# Patient Record
Sex: Female | Born: 1985 | Race: White | Hispanic: No | Marital: Married | Smoking: Never smoker
Health system: Southern US, Academic
[De-identification: ages and names within clinical notes are randomized; demographics above are authoritative.]

## PROBLEM LIST (undated history)

## (undated) DIAGNOSIS — N200 Calculus of kidney: Secondary | ICD-10-CM

## (undated) HISTORY — DX: Calculus of kidney: N20.0

## (undated) HISTORY — PX: LASIK: SHX215

## (undated) HISTORY — PX: HX HYSTERECTOMY: SHX81

## (undated) HISTORY — PX: HX OOPHORECTOMY: SHX86

---

## 2008-02-25 ENCOUNTER — Other Ambulatory Visit: Payer: Self-pay

## 2008-03-01 LAB — HISTORICAL CYTOPATHOLOGY-GYN (PAP AND HPV TESTS)

## 2008-04-09 LAB — HISTORICAL SURGICAL PATHOLOGY SPECIMEN

## 2008-05-25 LAB — HISTORICAL SURGICAL PATHOLOGY SPECIMEN

## 2008-09-27 LAB — HISTORICAL CYTOPATHOLOGY-GYN (PAP AND HPV TESTS)

## 2009-06-16 ENCOUNTER — Other Ambulatory Visit: Payer: Self-pay

## 2009-06-20 LAB — HISTORICAL CYTOPATHOLOGY-GYN (PAP AND HPV TESTS)

## 2011-03-26 ENCOUNTER — Ambulatory Visit (HOSPITAL_COMMUNITY): Payer: Self-pay | Admitting: Obstetrics & Gynecology

## 2012-05-01 ENCOUNTER — Other Ambulatory Visit: Payer: Self-pay

## 2012-05-06 LAB — HISTORICAL CYTOPATHOLOGY-GYN (PAP AND HPV TESTS): Final Diagnosis: NEGATIVE

## 2014-09-13 ENCOUNTER — Other Ambulatory Visit: Payer: Self-pay

## 2014-09-14 LAB — HISTORICAL SURGICAL PATHOLOGY SPECIMEN

## 2014-12-10 HISTORY — PX: HX HYSTERECTOMY: SHX81

## 2015-06-27 ENCOUNTER — Other Ambulatory Visit: Payer: Self-pay

## 2015-06-28 LAB — HISTORICAL SURGICAL PATHOLOGY SPECIMEN

## 2018-05-14 ENCOUNTER — Other Ambulatory Visit (HOSPITAL_BASED_OUTPATIENT_CLINIC_OR_DEPARTMENT_OTHER): Payer: Self-pay | Admitting: Obstetrics & Gynecology

## 2018-05-14 ENCOUNTER — Other Ambulatory Visit: Payer: Medicaid Other | Attending: Obstetrics & Gynecology

## 2018-05-14 DIAGNOSIS — N9089 Other specified noninflammatory disorders of vulva and perineum: Secondary | ICD-10-CM | POA: Insufficient documentation

## 2018-05-16 LAB — HISTORICAL SURGICAL PATHOLOGY SPECIMEN

## 2018-06-30 ENCOUNTER — Encounter (INDEPENDENT_AMBULATORY_CARE_PROVIDER_SITE_OTHER): Payer: Self-pay

## 2018-06-30 ENCOUNTER — Ambulatory Visit: Payer: Medicaid Other | Attending: NURSE PRACTITIONER | Admitting: NURSE PRACTITIONER

## 2018-06-30 ENCOUNTER — Ambulatory Visit (INDEPENDENT_AMBULATORY_CARE_PROVIDER_SITE_OTHER): Payer: Medicaid Other | Admitting: NURSE PRACTITIONER

## 2018-06-30 VITALS — BP 110/76 | HR 81 | Temp 98.2°F | Resp 16 | Ht 67.0 in | Wt 181.6 lb

## 2018-06-30 DIAGNOSIS — R3915 Urgency of urination: Secondary | ICD-10-CM

## 2018-06-30 DIAGNOSIS — R300 Dysuria: Secondary | ICD-10-CM

## 2018-06-30 DIAGNOSIS — R8290 Unspecified abnormal findings in urine: Secondary | ICD-10-CM

## 2018-06-30 MED ORDER — SULFAMETHOXAZOLE 800 MG-TRIMETHOPRIM 160 MG TABLET
1.00 | ORAL_TABLET | Freq: Two times a day (BID) | ORAL | 0 refills | Status: AC
Start: 2018-06-30 — End: 2018-07-07

## 2018-06-30 NOTE — Nursing Note (Signed)
06/30/18 1300   Urine test  (Siemens Multistix 10 SG)   Time collected 1314   Color Light   Clarity Cloudy   Glucose Negative   Bilirubin Negative   Ketones Negative   Urine Specific Gravity 1.010   Blood (urine) Small (1+)   pH 5.0   Protein Negative   Urobilinogen 0.2mg /dL (Normal)   Nitrite Negative   Leukocytes (!) 1+   Performed Status Manual   Lot # [id number]   Expiration Date 09/08/18   Initials els

## 2018-06-30 NOTE — Progress Notes (Signed)
[facility]  [address]  [place] [address]    Progress Note    Name: Karen Kennedy MRN:  [id number]   Date: 06/30/2018 Age: 32 y.o.         Reason for Visit: Urinary Tract Infection (started last sunday with left CVA pain, radiated around side into RLQ area, no hematuria, pain is dull now, urgency to urinate, burning with urination)    Nursing Notes:  Nursing Notes:   Starcher, Erica, RT  06/30/18 1315  Signed     07 /22/19 1300   Urine test  (Siemens Multistix 10 SG)   Time collected 1314   Color Light   Clarity Cloudy   Glucose Negative   Bilirubin Negative   Ketones Negative   Urine Specific Gravity 1.010   Blood (urine) Small (1+)   pH 5.0   Protein Negative   Urobilinogen 0.2mg /dL (Normal)   Nitrite Negative   Leukocytes (!) 1+   Performed Status Manual   Lot # [id number]   Expiration Date 09/08/18   Initials els       History of Present Illness  Karen Kennedy is a 32 y.o. female who is being seen today for dysuria, urgency and left CVA pain.  No fever/chills    Past Medical History:   Diagnosis Date   . Kidney stone          Past Surgical History:   Procedure Laterality Date   . HX HYSTERECTOMY     . HX OOPHORECTOMY           Current Outpatient Medications   Medication Sig   . Ibuprofen (MOTRIN) 200 mg Oral Tablet Take 800 mg by mouth Four times a day as needed for Pain   . trimethoprim-sulfamethoxazole (BACTRIM DS) 160-800mg  per tablet Take 1 Tab (160 mg total) by mouth Twice daily for 7 days     Allergies   Allergen Reactions   . Zithromax [Azithromycin] Rash     Family Medical History:     None            Social History     Tobacco Use   . Smoking status: Never Smoker   . Smokeless tobacco: Never Used   Substance Use Topics   . Alcohol use: Never     Frequency: Never   . Drug use: Never       Review of Systems  Review of Systems   Constitutional: Negative for chills and fever.   Gastrointestinal: Negative for abdominal pain, nausea and vomiting.   Genitourinary: Positive for dysuria, flank  pain, frequency and urgency.       Physical Exam:  BP 110/76   Pulse 81   Temp 36.8 C (98.2 F)   Resp 16   Ht 1.702 m (5\' 7" )   Wt 82.4 kg (181 lb 9.6 oz)   SpO2 99%   BMI 28.44 kg/m       Physical Exam   HENT:   Head: Normocephalic.   Cardiovascular: Normal rate.   Pulmonary/Chest: Effort normal.   Abdominal: Soft. She exhibits no distension. There is no tenderness. There is CVA tenderness.   Left CVA tenderness present.   Neurological: She is alert. Gait normal.   Skin: Skin is warm and dry.   Psychiatric: Affect normal.       Assessment:    Dysuria    Abnormal urinalysis    Urgency of urination       Plan:  Urinalysis shows leukocytes,  blood and is cloudy. Will prescribe antibiotic.  Good hydration advised.    Orders Placed This Encounter   . URINE CULTURE   . POCT URINE DIPSTICK   . trimethoprim-sulfamethoxazole (BACTRIM DS) 160-800mg  per tablet        Follow up: Follow up with PCP.  Seek medical attention for new or worsening symptoms.      I saw the patient independently.    Antony HasteCharlotte L Lantz, FNP-BC  06/30/2018, 13:32    I was personally available for consultation during this visit.  I have reviewed the chart and agree with the documentation as recorded by the APP, including the treatment plan and disposition.  Patient encouraged to follow up with PCP; recheck here as needed.    Hosie Spangleobert Kamala Kolton, MD  06/30/2018, 21:00  Electronically signed by Hosie Spangleobert Tinnie Kunin, MD

## 2018-07-02 LAB — URINE CULTURE: URINE CULTURE: >100000

## 2018-10-15 ENCOUNTER — Encounter (HOSPITAL_BASED_OUTPATIENT_CLINIC_OR_DEPARTMENT_OTHER): Payer: Self-pay

## 2018-10-15 ENCOUNTER — Encounter (HOSPITAL_BASED_OUTPATIENT_CLINIC_OR_DEPARTMENT_OTHER): Payer: Self-pay | Admitting: Family

## 2019-05-14 IMAGING — CT CT Abdomen and Pelvis W-Contrast
2 of 3 series · 15 of 46 positions shown, 17 images · IV contrast (Omnipaque)
Comparison: none

CT Abdomen and Pelvis W-Contrast
INDICATION: Abdominal Pain.                                                              
 Pertinent History: Upper abdominal pain, nausea, and diarrhea. Patient reports a history  
 of pancreatitis, states this pain feels the same as previous episodes.Diabetic.           
 Surgical History: : Ventral hernia x 2.   Cholecystectomy.  Colon resection with          
 colostomy and reversal due to diverticulitis                                              
 Cancer: None                                                                              
 GFR (past 30 days): >39 ml/min                                                            
 Metformin: None                                                                           
 Lipase: N/A       Amylase: N/A      WBC: N/A                                              
 Intravenous contrast: 100 mL Omnipaque 350                                                
 Oral contrast: No                                                                         
 Comments: Pregnancy test negative.
TECHNIQUE: Routine with IV contrast. Helical acquisition with sagittal and coronal        
 reformations; post IV contrast images.                                                    
 Utilized dose reduction techniques include: Automated Exposure Control, vendor specific   
 iterative reconstruction technique                                                        
 Comparison is made with previous exam from 12/05/2018.                                    
 The lung bases are clear.                                                                 
 Images of the liver demonstrate diffuse fatty infiltration. The gallbladder is been       
 removed. No gross biliary dilatation. The spleen is normal in size. No imaging evidence   
 of acute pancreatitis. The portal vein, SMA and SMV are patent.                           
 Images the bowel demonstrate no evidence of obstruction. The patient's known left         
 anterior abdominal wall hernia containing the splenic flexure of the colon is             
 redemonstrated. There is no associated bowel obstruction. The focal cystic lesion along   
 the margin of the left lateral abdominal wall hernia with some peripheral calcifications  
 is stable appearing measuring 7.1 x 5.7 cm, probably chronic seroma. Additional           
 multilobulated midline ventral hernia containing fat, grossly stable.                     
 The adrenal glands are unremarkable. Images of the kidneys demonstrate no hydronephrosis  
 or renal mass.                                                                            
 There is no evidence of obstruction. Anastomotic clips are present in the sigmoid colon   
 small apparent blind ending loop with of colon is seen extending off the lateral aspect   
 of the sigmoid, stable appearing.                                                         
 Images of the pelvis demonstrate no free fluid, mass or lymphadenopathy.

[Series 4: ax post · axial · 0.85mm/px · z∈[+602,+992]mm · 12 of 90 slices shown, 14 images]
[im 6/90  soft-tissue]
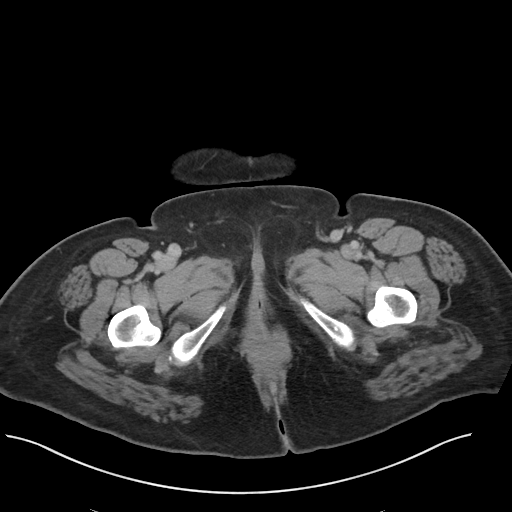
[im 6/90  bone]
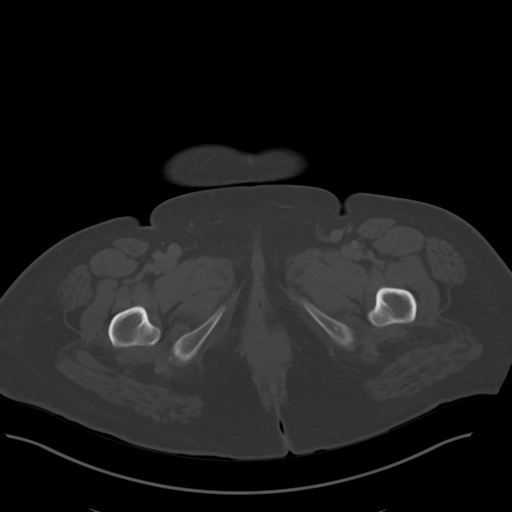
[im 12/90  soft-tissue]
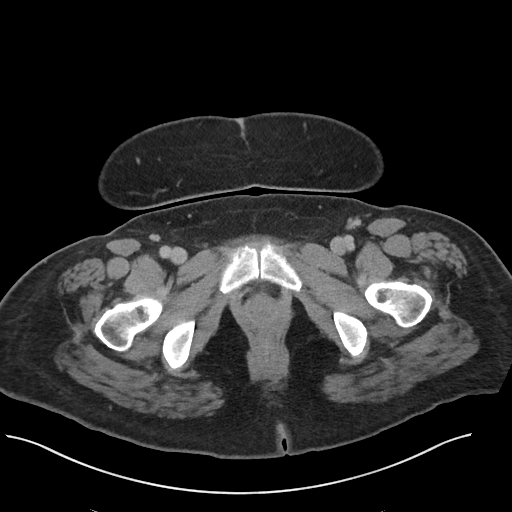
[im 21/90  soft-tissue]
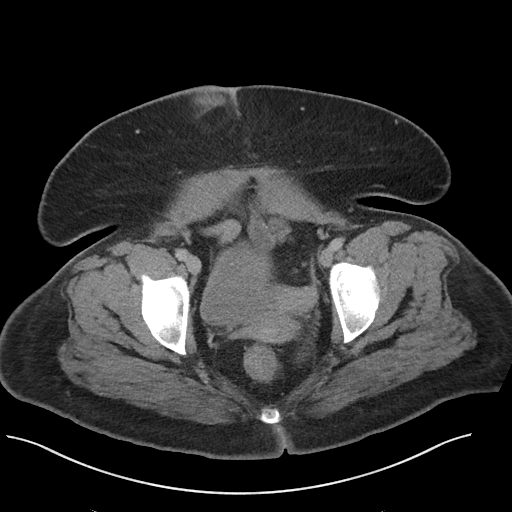
[im 26/90  soft-tissue]
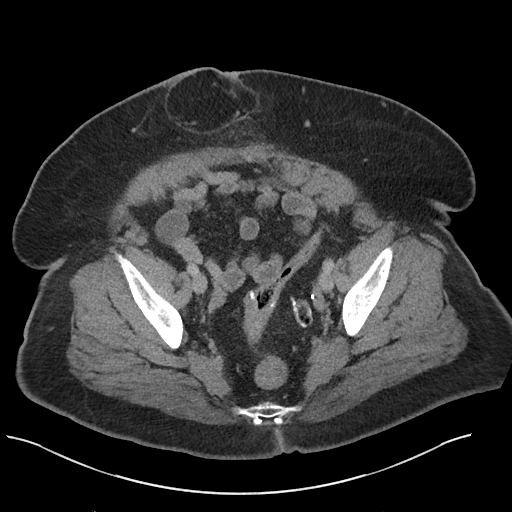
[im 35/90  soft-tissue]
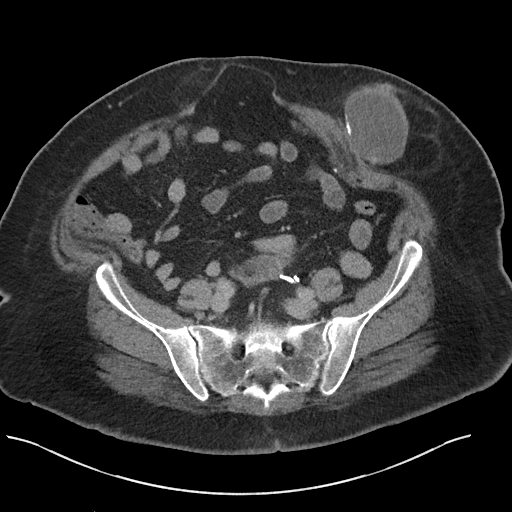
[im 41/90  soft-tissue]
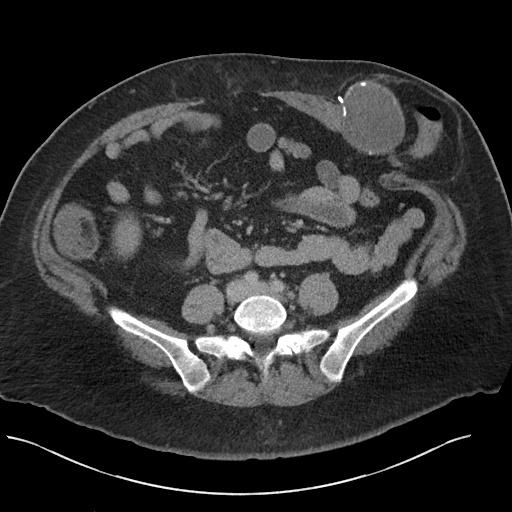
[im 49/90  soft-tissue]
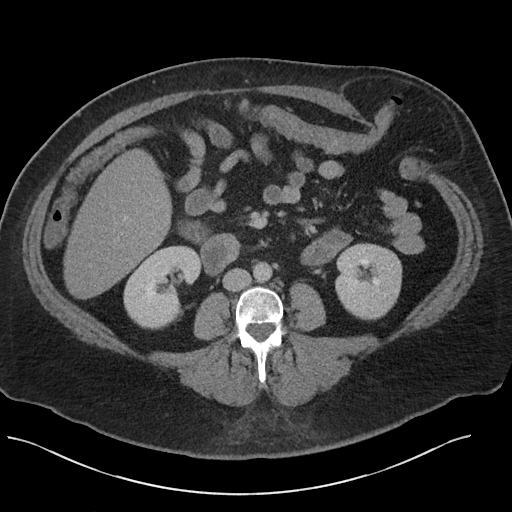
[im 55/90  soft-tissue]
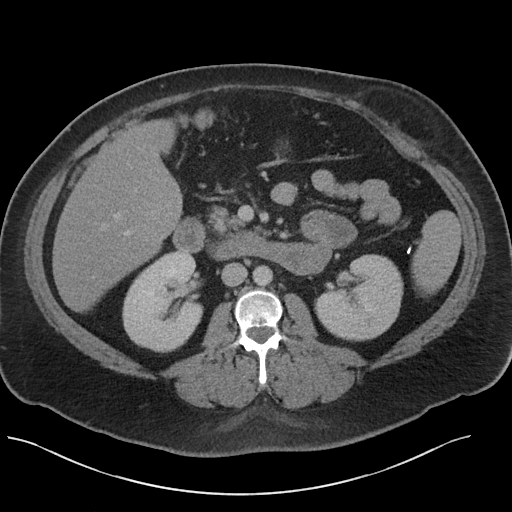
[im 64/90  soft-tissue]
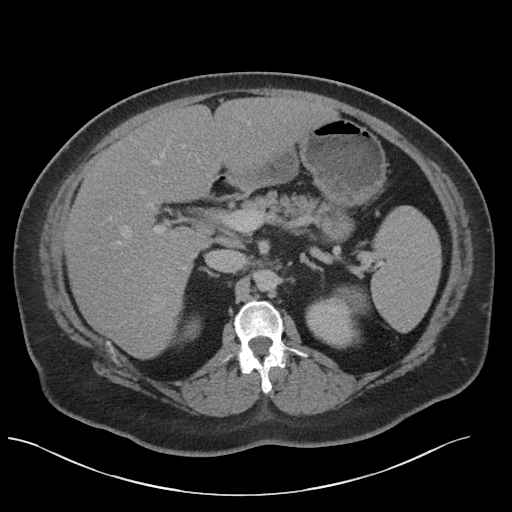
[im 64/90  bone]
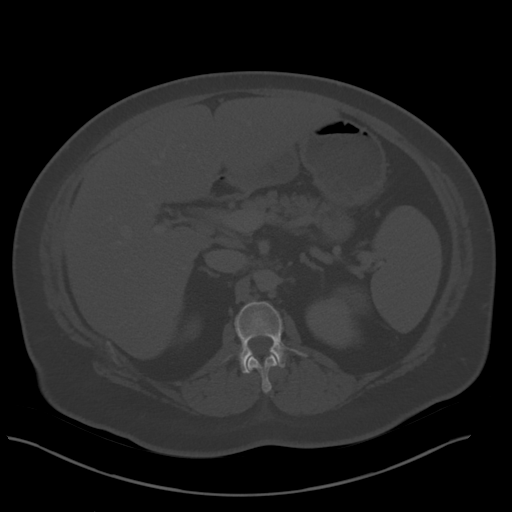
[im 69/90  soft-tissue]
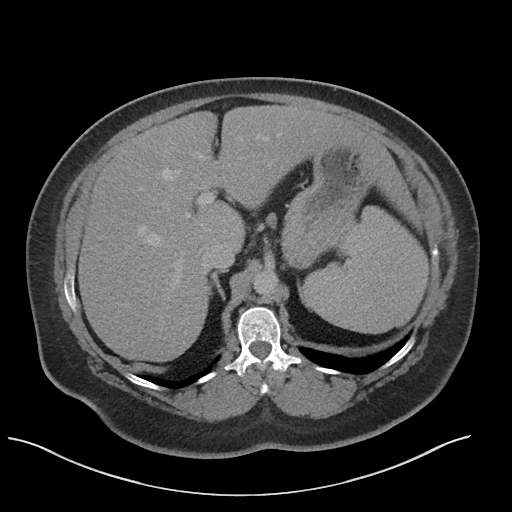
[im 78/90  soft-tissue]
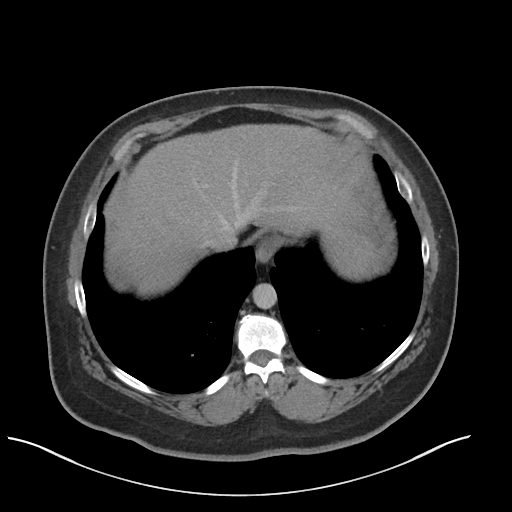
[im 84/90  soft-tissue]
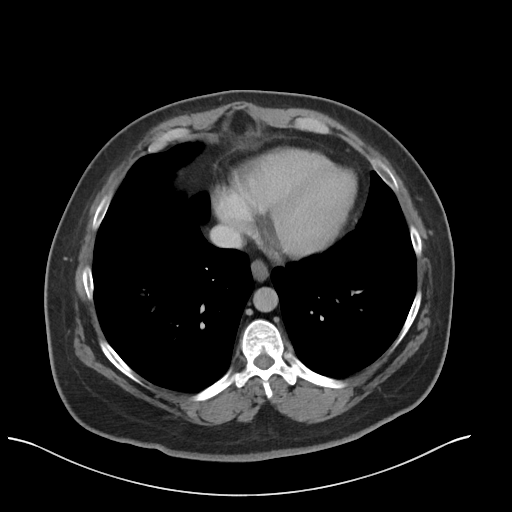

[Series 5: cor post · coronal · 0.82mm/px · 3 of 67 slices shown]
[im 23/67  soft-tissue]
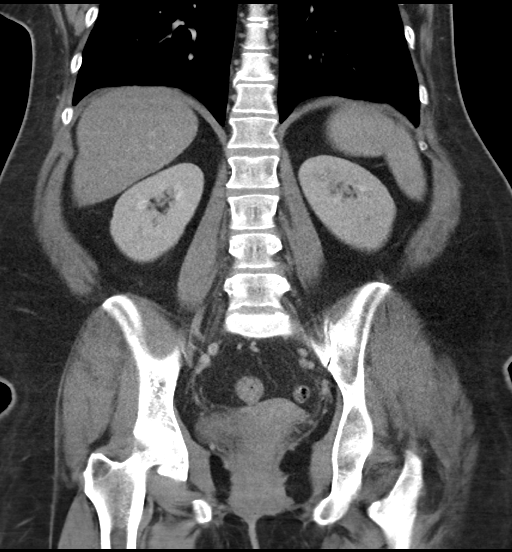
[im 30/67  soft-tissue]
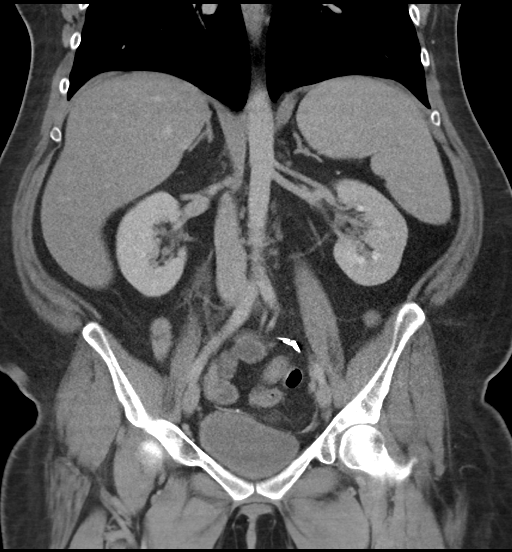
[im 37/67  soft-tissue]
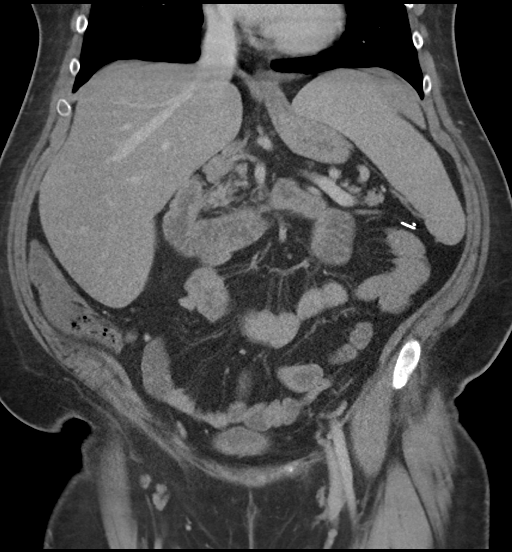

[15 of 46 positions shown; findings below may reference images not displayed]

IMPRESSION: 1. No imaging evidence of acute pancreatitis.                                             
 2. Fatty infiltration liver.                                                              
 3. Stable appearing ventral and left lateral anterior abdominal wall hernias. The large   
 peripherally calcified fluid density collection seen along the mouth of the left lateral  
 abdominal wall hernia is stable, probably chronic seroma.                                 
 4. Stable postsurgical changes sigmoid colon.

## 2021-03-31 ENCOUNTER — Other Ambulatory Visit (HOSPITAL_BASED_OUTPATIENT_CLINIC_OR_DEPARTMENT_OTHER): Payer: Self-pay | Admitting: Obstetrics & Gynecology

## 2021-03-31 DIAGNOSIS — F3289 Other specified depressive episodes: Secondary | ICD-10-CM

## 2021-03-31 MED ORDER — BUPROPION HCL XL 300 MG 24 HR TABLET, EXTENDED RELEASE
300.0000 mg | ORAL_TABLET | Freq: Every day | ORAL | 4 refills | Status: AC
Start: 2021-03-31 — End: 2021-04-30

## 2021-03-31 NOTE — Telephone Encounter (Signed)
Received a refill request for Bupropion 300 MG Tablet by fax. Scheduled AE on 06-16-21 with [facility]. Had last AE 05-20-20.    [facility]

## 2021-06-16 ENCOUNTER — Ambulatory Visit: Payer: Medicaid Other | Attending: Obstetrics & Gynecology

## 2021-06-16 ENCOUNTER — Ambulatory Visit (INDEPENDENT_AMBULATORY_CARE_PROVIDER_SITE_OTHER): Payer: Medicaid Other | Admitting: Obstetrics & Gynecology

## 2021-06-16 ENCOUNTER — Encounter (HOSPITAL_BASED_OUTPATIENT_CLINIC_OR_DEPARTMENT_OTHER): Payer: Self-pay | Admitting: Obstetrics & Gynecology

## 2021-06-16 ENCOUNTER — Other Ambulatory Visit: Payer: Self-pay

## 2021-06-16 VITALS — BP 122/74 | Wt 187.0 lb

## 2021-06-16 DIAGNOSIS — R5383 Other fatigue: Secondary | ICD-10-CM

## 2021-06-16 DIAGNOSIS — F3289 Other specified depressive episodes: Secondary | ICD-10-CM | POA: Insufficient documentation

## 2021-06-16 DIAGNOSIS — Z79899 Other long term (current) drug therapy: Secondary | ICD-10-CM | POA: Insufficient documentation

## 2021-06-16 DIAGNOSIS — Z01419 Encounter for gynecological examination (general) (routine) without abnormal findings: Secondary | ICD-10-CM

## 2021-06-16 LAB — CBC WITH DIFF
BASOPHIL #: <0.1 10*3/uL (ref <=0.20)
BASOPHIL %: 1 %
EOSINOPHIL #: <0.1 10*3/uL (ref <=0.50)
EOSINOPHIL %: 1 %
HCT: 42.1 % (ref 34.8–46.0)
HGB: 13.9 g/dL (ref 11.5–16.0)
IMMATURE GRANULOCYTE #: <0.1 10*3/uL (ref <0.10)
IMMATURE GRANULOCYTE %: 0 % (ref 0–1)
LYMPHOCYTE #: 1.83 10*3/uL (ref 1.00–4.80)
LYMPHOCYTE %: 25 %
MCH: 30.3 pg (ref 26.0–32.0)
MCHC: 33 g/dL (ref 31.0–35.5)
MCV: 91.9 fL (ref 78.0–100.0)
MONOCYTE #: 0.33 10*3/uL (ref 0.20–1.10)
MONOCYTE %: 5 %
MPV: 9.5 fL (ref 8.7–12.5)
NEUTROPHIL #: 4.97 10*3/uL (ref 1.50–7.70)
NEUTROPHIL %: 68 %
PLATELETS: 193 10*3/uL (ref 150–400)
RBC: 4.58 10*6/uL (ref 3.85–5.22)
RDW-CV: 12.3 % (ref 11.5–15.5)
WBC: 7.3 10*3/uL (ref 3.7–11.0)

## 2021-06-16 LAB — LIPID PANEL
CHOL/HDL RATIO: 5
CHOLESTEROL: 229 mg/dL — ABNORMAL HIGH (ref 100–200)
HDL CHOL: 46 mg/dL — ABNORMAL LOW (ref >=50)
LDL CALC: 149 mg/dL — ABNORMAL HIGH (ref <100)
NON-HDL: 183 mg/dL (ref <=190)
TRIGLYCERIDES: 168 mg/dL — ABNORMAL HIGH (ref <150)
VLDL CALC: 34 mg/dL — ABNORMAL HIGH (ref <30)

## 2021-06-16 LAB — COMPREHENSIVE METABOLIC PNL, FASTING
ALBUMIN: 4 g/dL (ref 3.5–5.0)
ALKALINE PHOSPHATASE: 71 U/L (ref 40–110)
ALT (SGPT): 17 U/L (ref 8–22)
ANION GAP: 6 mmol/L (ref 4–13)
AST (SGOT): 14 U/L (ref 8–45)
BILIRUBIN TOTAL: 0.6 mg/dL (ref 0.3–1.3)
BUN/CREA RATIO: 12 (ref 6–22)
BUN: 10 mg/dL (ref 8–25)
CALCIUM: 9.5 mg/dL (ref 8.5–10.0)
CHLORIDE: 103 mmol/L (ref 96–111)
CO2 TOTAL: 31 mmol/L — ABNORMAL HIGH (ref 22–30)
CREATININE: 0.86 mg/dL (ref 0.60–1.05)
ESTIMATED GFR: >90 mL/min/BSA (ref >=60)
GLUCOSE: 97 mg/dL (ref 70–99)
POTASSIUM: 3.8 mmol/L (ref 3.5–5.1)
PROTEIN TOTAL: 6.9 g/dL (ref 6.4–8.3)
SODIUM: 140 mmol/L (ref 136–145)

## 2021-06-16 LAB — THYROID STIMULATING HORMONE WITH FREE T4 REFLEX: TSH: 1.508 u[IU]/mL (ref 0.430–3.550)

## 2021-06-16 LAB — ESTRADIOL: ESTRADIOL: 68 pg/mL

## 2021-06-16 LAB — FSH: FSH: 4.4 m[IU]/mL

## 2021-06-16 MED ORDER — BUPROPION HCL XL 150 MG 24 HR TABLET, EXTENDED RELEASE
150.0000 mg | ORAL_TABLET | Freq: Every day | ORAL | 11 refills | Status: DC
Start: 2021-06-16 — End: 2021-07-10

## 2021-06-16 MED ORDER — PHENTERMINE 37.5 MG TABLET
ORAL_TABLET | ORAL | 2 refills | Status: DC
Start: 2021-06-16 — End: 2022-10-08

## 2021-06-16 NOTE — Progress Notes (Signed)
[facility]  Gynecology Consult    Karen Kennedy, Karen Kennedy, 35 y.o. female  Date of Admission:  (Not on file)  Date of service: 06/16/2021  Date of Birth:  [birth year]    PCP: No Pcp     Nursing Notes:   BoardRolly Salter, LPN  56/81/27 5170  Signed  BCM: TVH   Bone Density: NONE  Colonoscopy: NONE  Last Mammo: NONE  Last Pap:05/13/2017    HEMORRHOID THAT WONT GO AWAY   WANTS TO GO BACK TO THE WELLBUTRIN XL 150MG  STILL NO ENERGY        Information Obtained from: patient  Chief Complaint   Patient presents with   . Annual Exam        HPI/subjective     Karen Kennedy is a 35 y.o.No obstetric history on file., White female who presents for an annual well woman exam. Pt verbalizes that she wants to decrease her Wellbutrin to 150 mg due to increased fatigue. Discussed with the pt the need to exercise and began B12. Additionally, she complains of a persistent non bleeding/non painful hemorrhoid.  Discussed increasing physical activity.  She states that she sleeps well at night.  Will check laboratory studies on her.  Will try a injection of vitamin B12.  Will try on phentermine Number 30 refill x2.  Will see her back in November for re-evaluation.  Will also check her normal level as she still has 1 ovary    Objective    ROS:   Constitutional: + Fatigue. No fever, chills.  Cardiovascular: No chest pain, palpitations  Respiratory: No cough, shortness of breath  Abdomen: No abdominal pain, nausea, vomiting  Genitourinary: No vaginal discharge, vaginal bleeding  Rectal: + Non bleeding/non painful hemorrhoid.  Musculoskeletal: No joint pain or swelling  Neurologic: No headaches, dizziness, or paresthesias    PHYSICAL EXAMINATION:      Weight: 84.8 kg (187 lb)     BP (Non-Invasive): 122/74  No LMP recorded. Patient has had a hysterectomy.     Constitutional: No acute distress, well-developed, well-nourished  Head: Atraumatic, normocephalic  Cardiovascular: Heart with regular rate and rhythm, no murmurs, rubs, or gallops, normal  pulses  Respiratory: Breath sounds are clear, no wheezes, rales, or rhonchi  Abdomen: Abdomen is soft, non-tender without masses, no rebound or guarding  Breast: Symmetrical breast, no masses palpitated.  Genitourinary: Pap obtained. No adnexal masses palpitated.   Rectal: No external hemorrhoid. No masses palpitated.   Extremities: No cyanosis or edema  Neurologic: Grossly intact, alert and oriented x3, normal gait  Psychiatric: Normal affect and behavior    Family History:      OB History   Gravida Para Term Preterm AB Living   2 2 2          SAB IAB Ectopic Multiple Live Births                  # Outcome Date GA Lbr Len/2nd Weight Sex Delivery Anes PTL Lv   2 Term            1 Term              Past Medical History:   Diagnosis Date   . Kidney stone          Past Surgical History:   Procedure Laterality Date   . HX HYSTERECTOMY     . HX OOPHORECTOMY           Allergies   Allergen Reactions   .  Zithromax [Azithromycin] Rash     Dating Summary     No pregnancy episode available         Current Outpatient Medications   Medication Sig   . acyclovir (ZOVIRAX) 800 mg Oral Tablet Take 800 mg by mouth Once a day   . buPROPion (WELLBUTRIN XL) 300 mg extended release 24 hr tablet Take 1 Tablet (300 mg total) by mouth Once a day for 30 days   . Ibuprofen (MOTRIN) 200 mg Oral Tablet Take 800 mg by mouth Four times a day as needed for Pain       Impression/Recommendations:  1. Encounter for well woman exam with routine gynecological exam    2. Other depression        I am scribing for, and in the presence of, Dr. Valla Leaver for services provided on 06/16/2021.  Damaris Hippo, SCRIBE     I personally performed the services described in this documentation, as scribed  in my presence, and it is both accurate  and complete.    Radene Ou, MD

## 2021-06-16 NOTE — Nursing Note (Signed)
06/16/21 1300   Medication Administration   Initials HB   Witness Initials KAF   Medication  B 12   Medication Concentration 1000MCG/ML   Medication Dose Given   Route of Administration IM   Site Right Deltoid   NDC # A9130358   LOT # [id number]   Expiration date 11/08/22   Manufacturer [employer] K[employer]   Clinic Supplied Yes   Patient Supplied No   Comments: PATIENT TOLERATED WELL. CONSENT SIGNED BY ALL PARTIES.

## 2021-06-16 NOTE — Nursing Note (Signed)
BCM: TVH   Bone Density: NONE  Colonoscopy: NONE  Last Mammo: NONE  Last Pap:05/13/2017    HEMORRHOID THAT WONT GO AWAY   WANTS TO GO BACK TO THE WELLBUTRIN XL 150MG  STILL NO ENERGY

## 2021-06-18 LAB — HGA1C (HEMOGLOBIN A1C WITH EST AVG GLUCOSE): HEMOGLOBIN A1C: 5 % (ref <5.7)

## 2021-07-10 ENCOUNTER — Other Ambulatory Visit (HOSPITAL_BASED_OUTPATIENT_CLINIC_OR_DEPARTMENT_OTHER): Payer: Self-pay | Admitting: Obstetrics & Gynecology

## 2021-07-10 DIAGNOSIS — F3289 Other specified depressive episodes: Secondary | ICD-10-CM

## 2021-07-10 MED ORDER — BUPROPION HCL XL 150 MG 24 HR TABLET, EXTENDED RELEASE
150.0000 mg | ORAL_TABLET | Freq: Every day | ORAL | 11 refills | Status: DC
Start: 2021-07-10 — End: 2022-08-06

## 2021-07-10 NOTE — Telephone Encounter (Signed)
[facility]'d [facility] [address] for [facility] request for Bupropion hcl XL 150 mg tablets    Patients last annual was 05/20/20    Thank you, les

## 2021-07-12 ENCOUNTER — Telehealth (HOSPITAL_BASED_OUTPATIENT_CLINIC_OR_DEPARTMENT_OTHER): Payer: Self-pay | Admitting: Obstetrics & Gynecology

## 2021-07-12 DIAGNOSIS — E782 Mixed hyperlipidemia: Secondary | ICD-10-CM

## 2021-07-12 NOTE — Nursing Note (Signed)
Patient called and we reviewed the lab work with her and she will try diet and exercise for now instead of starting something for elevated cholesterol levels. And she will repeat those labs in about 6-8 weeks and see what they are then. Order was placed in the computer and patient verbalized understanding

## 2021-07-12 NOTE — Telephone Encounter (Signed)
Karen Kennedy was here in early July and had her pap and labs done. She doesn't have [place] my chart and would like a nurse to call her with the results. Thanks!

## 2021-07-12 NOTE — Telephone Encounter (Signed)
Left a message for the patient advising her that we did not complete a pap on her since she had a hysterectomy

## 2021-07-28 ENCOUNTER — Telehealth (HOSPITAL_BASED_OUTPATIENT_CLINIC_OR_DEPARTMENT_OTHER): Payer: Self-pay | Admitting: Obstetrics & Gynecology

## 2021-07-28 NOTE — Telephone Encounter (Signed)
The phone is not able to call out long distance at this time and has been down since 8 am this morning

## 2021-07-28 NOTE — Telephone Encounter (Signed)
Patient called. Stated she had a B12 shot about a month ago and wants to know when she can have another one.    [phone]  She is at work. If no answer, leave voice mail    Thank you, [place], Office Staff 07/28/21 09:23

## 2021-07-28 NOTE — Telephone Encounter (Signed)
No MyChart is set up for this patient to send her a message

## 2021-08-03 ENCOUNTER — Other Ambulatory Visit: Payer: Self-pay

## 2021-08-03 ENCOUNTER — Ambulatory Visit (INDEPENDENT_AMBULATORY_CARE_PROVIDER_SITE_OTHER): Payer: Medicaid Other

## 2021-08-03 DIAGNOSIS — R5383 Other fatigue: Secondary | ICD-10-CM

## 2021-08-03 NOTE — Nursing Note (Signed)
08/03/21 1000   B12 Injection flowsheet   Medication Vitamin B12   Dose (mcg) 1,000 mcg   Site Right Deltoid   Route IM   Lot # [id number]   Expiration date 11/08/22   Manufacturer [employer] kabi   NDC # 95638-756-43   Initials HB   Who supplied B12 Clinic supplied   Comment PER V.O. DR. Valla Leaver PATIENT WAS GIVEN A VITAMIN B12 INJECTION IM IN THE RIGHT DELTOID AND TOLERATED IT WELL. CONSENT FORM WAS SIGNED BY ALL PARTIES.

## 2021-08-10 ENCOUNTER — Other Ambulatory Visit (HOSPITAL_BASED_OUTPATIENT_CLINIC_OR_DEPARTMENT_OTHER): Payer: Self-pay | Admitting: Obstetrics & Gynecology

## 2021-08-10 MED ORDER — ACYCLOVIR 800 MG TABLET
800.0000 mg | ORAL_TABLET | Freq: Every day | ORAL | 4 refills | Status: AC
Start: 2021-08-10 — End: 2021-09-09

## 2021-08-10 NOTE — Telephone Encounter (Signed)
[facility] for a refill request on Acyclovir 800 mg tabs.     Last annual visit: 06/16/21  Appt for 10/16/21 for gyn visit.    Thank you, [place], Office Staff 08/10/21 08:39

## 2021-09-07 ENCOUNTER — Other Ambulatory Visit: Payer: Self-pay

## 2021-09-07 ENCOUNTER — Ambulatory Visit (INDEPENDENT_AMBULATORY_CARE_PROVIDER_SITE_OTHER): Payer: Medicaid Other

## 2021-09-07 DIAGNOSIS — R5383 Other fatigue: Secondary | ICD-10-CM

## 2021-09-07 MED ORDER — CYANOCOBALAMIN (VIT B-12) 1,000 MCG/ML INJECTION SOLUTION
1000.0000 ug | INTRAMUSCULAR | Status: AC
Start: 2021-09-07 — End: 2021-09-07
  Administered 2021-09-07: 1000 ug via INTRAMUSCULAR

## 2021-10-09 ENCOUNTER — Ambulatory Visit (INDEPENDENT_AMBULATORY_CARE_PROVIDER_SITE_OTHER): Payer: Medicaid Other

## 2021-10-09 ENCOUNTER — Other Ambulatory Visit: Payer: Self-pay

## 2021-10-09 VITALS — Ht 67.0 in | Wt 183.0 lb

## 2021-10-09 DIAGNOSIS — R5383 Other fatigue: Secondary | ICD-10-CM

## 2021-10-09 NOTE — Nursing Note (Signed)
10/09/21 1100   Medication Administration   Initials LR   Witness Initials CKH   Medication  B 12   Medication Dose Given 1   Medication Volume Given (mL) ml   Route of Administration IM   Site Left Deltoid   NDC # [id number]   LOT # [id number]   Expiration date 11/08/22   Manufacturer [employer]   Clinic Supplied Yes   Patient Supplied No

## 2021-10-16 ENCOUNTER — Ambulatory Visit (HOSPITAL_BASED_OUTPATIENT_CLINIC_OR_DEPARTMENT_OTHER): Payer: Medicaid Other | Admitting: Obstetrics & Gynecology

## 2021-10-20 ENCOUNTER — Ambulatory Visit (HOSPITAL_BASED_OUTPATIENT_CLINIC_OR_DEPARTMENT_OTHER): Payer: Self-pay | Admitting: Obstetrics & Gynecology

## 2021-11-23 ENCOUNTER — Encounter (HOSPITAL_BASED_OUTPATIENT_CLINIC_OR_DEPARTMENT_OTHER): Payer: Self-pay

## 2021-11-23 ENCOUNTER — Other Ambulatory Visit: Payer: Self-pay

## 2021-11-23 ENCOUNTER — Ambulatory Visit (INDEPENDENT_AMBULATORY_CARE_PROVIDER_SITE_OTHER): Payer: Medicaid Other

## 2021-11-23 DIAGNOSIS — R5383 Other fatigue: Secondary | ICD-10-CM

## 2021-11-23 NOTE — Nursing Note (Signed)
11/23/21 0800   B12 Injection flowsheet   Medication Vitamin B12   Dose (mcg) 1,000 mcg   Site Right Deltoid   Route IM   Lot # [id number]   Expiration date 03/11/23   Manufacturer [employer]   NDC # 9811-9147-82   Initials CKH   Who supplied B12 Clinic supplied   Comment patient tolerated well

## 2021-12-10 ENCOUNTER — Encounter (INDEPENDENT_AMBULATORY_CARE_PROVIDER_SITE_OTHER): Payer: Medicaid Other | Admitting: Radiology

## 2021-12-10 ENCOUNTER — Other Ambulatory Visit: Payer: Self-pay

## 2021-12-10 ENCOUNTER — Ambulatory Visit (INDEPENDENT_AMBULATORY_CARE_PROVIDER_SITE_OTHER): Payer: Medicaid Other | Admitting: NURSE PRACTITIONER

## 2021-12-10 ENCOUNTER — Encounter (INDEPENDENT_AMBULATORY_CARE_PROVIDER_SITE_OTHER): Payer: Self-pay

## 2021-12-10 VITALS — BP 98/72 | HR 79 | Temp 97.2°F | Resp 16 | Ht 67.0 in | Wt 188.3 lb

## 2021-12-10 DIAGNOSIS — S6991XA Unspecified injury of right wrist, hand and finger(s), initial encounter: Secondary | ICD-10-CM

## 2021-12-10 DIAGNOSIS — W19XXXA Unspecified fall, initial encounter: Secondary | ICD-10-CM

## 2021-12-10 DIAGNOSIS — S4992XA Unspecified injury of left shoulder and upper arm, initial encounter: Secondary | ICD-10-CM

## 2021-12-10 NOTE — Progress Notes (Signed)
URGENT CARE, [facility]  [address]  [place] [address]    Progress Note    Name: Karen Kennedy MRN:  [id number]   Date: 12/10/2021 Age: 36 y.o.         Reason for Visit: Injury [place] on ice 12-03-21, injured rt wrist, left shoulder.) and  Pain (Left rib, since Oct after coughing)    Nursing Notes:  There are no exam notes on file for this visit.    History of Present Illness  Karen Kennedy is a 36 y.o. female who is being seen today for above symptoms which began about one week ago due to fall on outstretched right hand and pain has continued. Patient is also having some left posterior lower ribcage pain which began during respiratory infection causing cough and has persisted. Patient denies any other rib injury.  Patient has seen chiropractor this pain.  Patient has taken no medications for the pain/symptoms.     Past Medical History:   Diagnosis Date   . Kidney stone          Past Surgical History:   Procedure Laterality Date   . HX HYSTERECTOMY     . HX OOPHORECTOMY     . LASIK           Current Outpatient Medications   Medication Sig   . buPROPion (WELLBUTRIN XL) 150 mg extended release 24 hr tablet Take 1 Tablet (150 mg total) by mouth Once a day for 30 days   . buPROPion (WELLBUTRIN XL) 300 mg extended release 24 hr tablet Take 1 Tablet (300 mg total) by mouth Once a day for 30 days   . cyanocobalamin (VITAMIN B12) 1,000 mcg/mL Injection Solution 1 mL (1,000 mcg total) Every 30 days   . Ibuprofen (MOTRIN) 200 mg Oral Tablet Take 4 Tablets (800 mg total) by mouth Four times a day as needed for Pain   . phentermine (ADIPEX-P) 37.5 mg Oral Tablet One p.o. q.a.m.     Allergies   Allergen Reactions   . Zithromax [Azithromycin] Rash     Family Medical History:    None         Social History     Tobacco Use   . Smoking status: Never   . Smokeless tobacco: Never   Substance Use Topics   . Alcohol use: Never   . Drug use: Never       Review of Systems  Review of Systems   Constitutional:  Negative for chills and fever.   Respiratory: Negative for sputum production and shortness of breath.    Cardiovascular: Negative for chest pain and palpitations.   Musculoskeletal: Positive for joint pain.        See HPI   Neurological: Negative for tingling and sensory change.       Physical Exam:  BP 98/72   Pulse 79   Temp 36.2 C (97.2 F)   Resp 16   Ht 1.702 m (5\' 7" )   Wt 85.4 kg (188 lb 4.4 oz)   SpO2 99%   BMI 29.49 kg/m       Physical Exam  Pulmonary:      Effort: Pulmonary effort is normal.   Chest:      Comments: Posterior left lower ribcage tender to palpation  Musculoskeletal:      Left shoulder: No swelling or deformity.      Right elbow: Normal.      Right wrist: Bony tenderness present. No  deformity or snuff box tenderness. Normal pulse.      Comments: Lateral aspect of anterior shoulder is tender to palpation.  Right wrist, distal radial side of wrist tender to palpation  Distal sensation intact, move fingers without difficulty,  No deformity.    Neurological:      Mental Status: She is alert.         Assessment:    Fall    Injury of right wrist, initial encounter    Injury of left shoulder, initial encounter    Injury of left acromioclavicular joint, initial encounter         Plan:  Rest  Apply ice 20 minutes several times daily.  Xray results explained to patient.  I advised to avoid lifting with left shoulder, leaning on left elbow and other activities that might aggravated the problem.  OTC Ibuprofen 200mg  po tid next one week.  Notify clinic if symptoms not improving next one week.  Discussed possible PT, patient declined at current time.  Written patient education information provided.   XR SHOULDER LEFT    Result Date: 12/10/2021  EXAMINATION: Left shoulder HISTORY: [place] one week earlier with left shoulder injury. Persistent pain. 3 views of left shoulder were obtained. Head is normally located. Bone mineralization is normal. No acute fracture is identified. There is borderline  widening of the left AC joint measuring 8 mm which could indicate grade 1 AC joint injury. Please correlate with any point tenderness in this region. Dedicated films of the AC joints could be obtained if clinically appropriate.     1. Possible grade 1 left AC joint injury. 2. No acute fracture. Radiologist location ID: [id number]     XR WRIST RIGHT    Result Date: 12/10/2021  EXAMINATION: Right wrist HISTORY: [place] one week earlier with right wrist injury. Four views of the right wrist were obtained, including a scaphoid view. Bone mineralization is normal. No acute fracture is identified. No destructive bony lesions are seen. There is no significant arthritic process. The scaphoid is intact. Small accessory ossicle seen adjacent to the tip of the ulnar styloid process.     1. No acute bony injury about the right wrist. Radiologist location ID: [id number]     Orders Placed This Encounter   . XR WRIST RIGHT   . XR SHOULDER LEFT        MDM:       During the patient's visit at urgent care patient's chart was reviewed; HPI, ROS and physical exam were completed to assist in medical decision making.     Medications were reviewed and reconciled. Medication instructions and side effects were discussed.   Advised to seek immediate medical care with any new, worsening, or concerning symptoms.   Opportunity to ask questions was provided and all questions were answered.   Discussed diagnosis and management including indications for return, importance of close follow up and supportive care measures prior to patient discharge.       Follow up: Follow up with PCP.          Thomasenia Bottoms, FNP-BC  12/10/2021, 16:22

## 2021-12-25 ENCOUNTER — Encounter (HOSPITAL_BASED_OUTPATIENT_CLINIC_OR_DEPARTMENT_OTHER): Payer: Self-pay

## 2021-12-25 ENCOUNTER — Ambulatory Visit (INDEPENDENT_AMBULATORY_CARE_PROVIDER_SITE_OTHER): Payer: Medicaid Other

## 2021-12-25 ENCOUNTER — Other Ambulatory Visit: Payer: Self-pay

## 2021-12-25 VITALS — BP 108/62 | Ht 67.0 in | Wt 190.1 lb

## 2021-12-25 DIAGNOSIS — R5383 Other fatigue: Secondary | ICD-10-CM

## 2021-12-26 MED ORDER — CYANOCOBALAMIN (VIT B-12) 1,000 MCG/ML INJECTION SOLUTION
1000.0000 ug | INTRAMUSCULAR | Status: AC
Start: 2021-12-26 — End: ?
  Administered 2022-02-01: 1000 ug via INTRAMUSCULAR

## 2022-01-02 ENCOUNTER — Ambulatory Visit (INDEPENDENT_AMBULATORY_CARE_PROVIDER_SITE_OTHER): Payer: Medicaid Other

## 2022-01-02 ENCOUNTER — Other Ambulatory Visit: Payer: Self-pay

## 2022-02-01 ENCOUNTER — Other Ambulatory Visit: Payer: Self-pay

## 2022-02-01 ENCOUNTER — Ambulatory Visit (INDEPENDENT_AMBULATORY_CARE_PROVIDER_SITE_OTHER): Payer: Medicaid Other

## 2022-02-01 ENCOUNTER — Encounter (HOSPITAL_BASED_OUTPATIENT_CLINIC_OR_DEPARTMENT_OTHER): Payer: Self-pay

## 2022-02-01 VITALS — Ht 67.0 in | Wt 190.0 lb

## 2022-02-01 DIAGNOSIS — R5383 Other fatigue: Secondary | ICD-10-CM

## 2022-02-02 ENCOUNTER — Ambulatory Visit (INDEPENDENT_AMBULATORY_CARE_PROVIDER_SITE_OTHER): Payer: Medicaid Other | Admitting: Rehabilitative and Restorative Service Providers"

## 2022-02-02 DIAGNOSIS — S43102A Unspecified dislocation of left acromioclavicular joint, initial encounter: Secondary | ICD-10-CM

## 2022-02-02 DIAGNOSIS — M7542 Impingement syndrome of left shoulder: Secondary | ICD-10-CM

## 2022-02-02 NOTE — Progress Notes (Signed)
Physical Therapy, [facility]  [address]  [place] [address]  [phone]  Physical Therapy Progress Note       Patient Name: Karen Kennedy  Date of Birth: 03-16-1986  Height:     Weight:         Subjective & Objective:   Subjective Evaluation    History of Present Illness  Mechanism of injury: Pt referred to PT treatment for Left shoulder impingement and AC joint separation suffered during a slip and fall on 12/03/21. X-rays demonstrate grade 1 AC joint sprain per radiology report. Reports trying to catch someone falling and reached and caught them them, but injured Left shoulder approximately one month ago. Reports pain is "charley horse" pain in pectoralis region and anterior L shoulder. Difficulty reaching behind head and behind back w/ tightness. Pt had corticosteroid injection in L shoulder.    MRI is next if pain continues.    PLOF: R hand dominant, independent w/ all activities, works at [employer] office    Past Medical History:  Diagnosis Date  . Kidney stone     Past Surgical History:  Procedure Laterality Date  . HX HYSTERECTOMY    . HX OOPHORECTOMY    . LASIK          Quality of life: good    Pain  Current pain rating: 2  At best pain rating: 0  At worst pain rating: 5  Location: L shoulder  Quality: dull ache  Relieving factors: medications, change in position, relaxation and rest (anti-inflammatory)  Aggravating factors: overhead activity, movement and lifting  Progression: improved    Social Support  Lives with: spouse    Diagnostic Tests  Abnormal x-ray: AC joint grade one.    Treatments  Current treatment: physical therapy  Patient Goals  Patient goals for therapy: decreased pain, improved balance, increased strength, increased motion and return to sport/leisure activities            Assessment:   Objective     Passive Range of Motion     Additional Passive Range of Motion Details  L shoulder PROM  chest stretch w/ tightness  noted      Strength/Myotome Testing     Additional Strength Details  STRENGTH RIGHT, LEFT  Shoulder flexion 5/5, 5./5  abduction 5/5, 5/5  scaption  5/5, 5/5  ER 5/5, 4+/5  IR 5/5, 4+/5 discomfort  elbow flexion 5/5, 5/5  elbow extension 5/5, 4+/5 discomfort in chest  wrist flexion 5/5, 5/5  wrist extension 5/5, 5/5  thumb extension 5/5, 5/5  digit abduction 5/5, 5/5    Appley scratch test w/ pain in anterior shoulder  able to place hands behind head, behind back  Hawkins-Kennedy positive pain posteriorly  Overhead compression negative       Plan:   Assessment & Plan     Assessment  Impairments: abnormal coordination, abnormal gait, abnormal muscle firing, abnormal muscle tone, abnormal or restricted ROM, activity intolerance, impaired balance, impaired physical strength, lacks appropriate home exercise program and pain with function  Assessment details: Pt referred to PT treatment due to Left AC joint sprain and L shoulder impingement. Pt has minimal pain w/ elevation and ROM, but has tightness and discomfort in anterior chest and posterior shoulder at end ROM in abduction and ER. Pt has minimal symptoms w/ reaching behind back and overhead. Discussed POC and goals w/ pt. Pt verbalizes understanding and agreement. Instructed in chest stretching and stretching into ER. Pt verbalizes understanding.  Pt will benefit from continued PT intervention and PT may include modalities and manual techniques to promote ROM and pain relief.   Prognosis: good    Goals  LTG's 12 weeks: 1. Pt will report 0-3/10 in L shoulder. 2. Pt will improve L shoulder strength to 4+/5 for reaching. 3. Pt will improve L shoulder ROM to within 15 degrees of R shoulder in all planes. 4. Pt will demo independence w/ HEP. 5. Pt will demo reaching behind head, behind back, and overhead w/o limitations.     Plan  Therapy options: will be seen for skilled physical therapy services  Planned modality interventions: cryotherapy, electrical  stimulation/Russian stimulation, TENS, thermotherapy (hydrocollator packs) and ultrasound  Planned therapy interventions: balance/weight-bearing training, bed mobility training, body mechanics training, dressing changes, fine motor coordination training, flexibility, manual therapy, motor coordination training, muscle pump exercises, neuromuscular re-education, postural training, soft tissue mobilization, functional ROM exercises, gait training, home exercise program, stretching, strengthening, therapeutic activities and joint mobilization  Frequency: 2x week  Duration in weeks: 12  Treatment plan discussed with: patient        Continue to follow patient according to established plan of care.  The risks/benefits of therapy have been discussed with the patient/caregiver and he/she is in agreement with the established plan of care.       Therapist:   Glade Nurse, PT, DPT  02/02/2022, 12:58     Start Time: 1300  End Time: 1345  Total Treatment Time: PT evaluation 45 minutes

## 2022-02-05 ENCOUNTER — Other Ambulatory Visit: Payer: Self-pay

## 2022-02-05 ENCOUNTER — Ambulatory Visit (INDEPENDENT_AMBULATORY_CARE_PROVIDER_SITE_OTHER): Payer: Medicaid Other | Admitting: Rehabilitative and Restorative Service Providers"

## 2022-02-05 DIAGNOSIS — S43102A Unspecified dislocation of left acromioclavicular joint, initial encounter: Secondary | ICD-10-CM

## 2022-02-05 DIAGNOSIS — M7542 Impingement syndrome of left shoulder: Secondary | ICD-10-CM

## 2022-02-05 NOTE — Progress Notes (Unsigned)
Physical Therapy, [facility]  [address]  [place] [address]  [phone]  Physical Therapy Progress Note       Patient Name: Karen Kennedy  Date of Birth: [birth year]  Height:     Weight:     Room/Bed: Room/bed info not found  Payor: [employer] / Plan: [employer] [place] - MEDICAID / Product Type: [place] /       Subjective & Objective:     Pt reports continued soreness in left anterolateral shoulder and tightness w/ reaching. Reports mild increase in soreness after initial evaluation.   See flowsheet for details. Manual PROM and end ROM stretching in anterior shoulder capsule, chest wall, and all planes of ROM. Manual assisted scapula mobility. Initiated Graston Technique in Left upper trapezius to promote soft tissue mobility. Therapeutic exercise including wand flexion, abduction, IR, corner stretch to promote ROM and mobility in L shoulder.     Assessment:    Pt has continued tightness in anterior left shoulder. Discussed including Graston Technique along anterior left shoulder at next treatment. Pt agrees with plan. Pt has moderate redness in upper trapezius after a few minutes of treatment and limited treatment time due to skin symptoms. Pt reports increased fatigue and soreness in L shoulder after treatment. Pt will monitor symptoms and PT will adjust treatment plan at next visit.     Plan:     Continue to follow patient according to established plan of care.  The risks/benefits of therapy have been discussed with the patient/caregiver and he/she is in agreement with the established plan of care.       Therapist:   Glade Nurse, PT, DPT  02/05/2022, 15:42    Start Time: 1520  End Time: 1605  Total Treatment Time: 45 minutes, therapeutic exercise 20 minutes, 1 unit, manual therapy 25 minutes, 2 units

## 2022-02-14 ENCOUNTER — Ambulatory Visit (INDEPENDENT_AMBULATORY_CARE_PROVIDER_SITE_OTHER): Payer: Medicaid Other | Admitting: Rehabilitative and Restorative Service Providers"

## 2022-02-14 ENCOUNTER — Other Ambulatory Visit: Payer: Self-pay

## 2022-02-14 DIAGNOSIS — S43102A Unspecified dislocation of left acromioclavicular joint, initial encounter: Secondary | ICD-10-CM

## 2022-02-14 DIAGNOSIS — M7542 Impingement syndrome of left shoulder: Secondary | ICD-10-CM

## 2022-02-14 NOTE — Progress Notes (Unsigned)
Physical Therapy, [facility]  [address]  [place] [address]  [phone]  Physical Therapy Progress Note       Patient Name: Karen Kennedy  Date of Birth: May 15, 1986  Height:     Weight:     Room/Bed: Room/bed info not found  Payor: [employer] / Plan: [employer] [place] - [place] / Product Type: [place] /       Subjective & Objective:   Pt reports improved symptoms in L shoulder. Reports continued difficulty sleeping on Left side, but ROM and mobility continues to improve.     See flowsheet for details. Manual PROM and end ROM stretching in anterior shoulder capsule, chest wall, and all planes of ROM. Manual assisted scapula mobility. Initiated Graston Technique in Left upper trapezius to promote soft tissue mobility. Therapeutic exercise including wand flexion, abduction, IR, corner stretch to promote ROM and mobility in L shoulder.     Assessment:    PT performed Graston Technique along anterior shoulder and scapula mobs to promote end ROM. Pt demonstrated improved flexion and ER following treatment. Pt has moderate redness following manual techniques. Pt educated on possible bruising and soreness following treatment. Pt's ROM improving w/ PT treatment. Pt continues to have pain at night and w/ lying on Left shoulder.     Plan:     Continue to follow patient according to established plan of care.  The risks/benefits of therapy have been discussed with the patient/caregiver and he/she is in agreement with the established plan of care.       Therapist:   Glade Nurse, PT, DPT  02/14/2022, 16:03    Start Time: 7628  End Time: 1650  Total Treatment Time: 47 minutes,  therapeutic exercise 22 minutes, 1 unit, manual therapy 25 minutes, 2 units

## 2022-02-21 ENCOUNTER — Other Ambulatory Visit: Payer: Self-pay

## 2022-02-21 ENCOUNTER — Ambulatory Visit (INDEPENDENT_AMBULATORY_CARE_PROVIDER_SITE_OTHER): Payer: Medicaid Other | Admitting: Rehabilitative and Restorative Service Providers"

## 2022-02-21 DIAGNOSIS — M7542 Impingement syndrome of left shoulder: Secondary | ICD-10-CM

## 2022-02-21 DIAGNOSIS — S43102A Unspecified dislocation of left acromioclavicular joint, initial encounter: Secondary | ICD-10-CM

## 2022-02-21 NOTE — Progress Notes (Unsigned)
Physical Therapy, [facility]  [address]  [place] [address]  [phone]  Physical Therapy Progress Note       Patient Name: Karen Kennedy  Date of Birth: 10/31/1986  Height:     Weight:     Room/Bed: Room/bed info not found  Payor: [employer] / Plan: [employer] [place] - [place]ICA[place] / Product Type: [place] /       Subjective & Objective:   Pt reports her left shoulder is gradually feeling better, but continues to have achiness and stiffness in L shoulder, but pain is improving.     See flowsheet for details. Manual PROM and end ROM stretching in anterior shoulder capsule, chest wall, and all planes of ROM. Manual assisted scapula mobility. Initiated Graston Technique in Left upper trapezius to promote soft tissue mobility. Therapeutic exercise including wand flexion, abduction, IR, corner stretch to promote ROM and mobility in L shoulder.    Assessment:    PT focused on manual techniques to promote soft tissue mobility in L anterior shoulder and end ROM stretching w/ scapula mobility. Pt's ROM improves w/ manual techniques. Pt has moderate redness in L shoulder. Pt progressing w/ PT treatment. Pt will benefit from continued PT intervention to promote return to prior level of function.     Plan:     Continue to follow patient according to established plan of care.  The risks/benefits of therapy have been discussed with the patient/caregiver and he/she is in agreement with the established plan of care.       Therapist:   Glade Nurse, PT, DPT  02/21/2022, 16:16    Start Time: 1615  End Time: 1700  Total Treatment Time: 45 minutes, manual therapy 30 minutes, 2 units, therapeutic exercise 15 minutes, 1 unit

## 2022-02-23 ENCOUNTER — Encounter (INDEPENDENT_AMBULATORY_CARE_PROVIDER_SITE_OTHER): Payer: Self-pay | Admitting: Rehabilitative and Restorative Service Providers"

## 2022-03-01 ENCOUNTER — Other Ambulatory Visit: Payer: Self-pay

## 2022-03-01 ENCOUNTER — Ambulatory Visit (INDEPENDENT_AMBULATORY_CARE_PROVIDER_SITE_OTHER): Payer: Medicaid Other | Admitting: Rehabilitative and Restorative Service Providers"

## 2022-03-01 DIAGNOSIS — S43102A Unspecified dislocation of left acromioclavicular joint, initial encounter: Secondary | ICD-10-CM

## 2022-03-01 DIAGNOSIS — M7542 Impingement syndrome of left shoulder: Secondary | ICD-10-CM

## 2022-03-01 NOTE — Progress Notes (Unsigned)
Physical Therapy, [facility]  [address]  [place] [address]  [phone]  Physical Therapy Progress Note       Patient Name: Karen Kennedy  Date of Birth: 18-Feb-1986  Height:     Weight:     Room/Bed: Room/bed info not found  Payor: [employer] / Plan: [employer] [place] / Product Type: [place] /       Subjective & Objective:   Pt reports improved symptoms and ROM in left shoulder, but continues to have pain in left anterolateral shoulder w/ activity.     See flowsheet for details. Manual PROM and end ROM stretching in anterior shoulder capsule, chest wall, and all planes of ROM. Manual assisted scapula mobility. Initiated Graston Technique in Left upper trapezius to promote soft tissue mobility. Therapeutic exercise including wand flexion, abduction, IR, corner stretch to promote ROM and mobility in L shoulder.    Assessment:    Pt has moderate redness following manual treatment to left anterior shoulder. Pt's ROM slightly limited at end ROM in flexion. Pt has palpable tenderness over left anterolateral shoulder. Symptoms have improved w/ PT, but continues to have pain that limits activity.     Plan:     Continue to follow patient according to established plan of care.  The risks/benefits of therapy have been discussed with the patient/caregiver and he/she is in agreement with the established plan of care.       Therapist:   Glade Nurse, PT, DPT  03/01/2022, 16:38    Start Time: 1610  End Time: 1655  Total Treatment Time: 45 minutes, therapeutic exercise 15 minutes, 1 unit, manual therapy 30 minutes, 2 units

## 2022-03-05 ENCOUNTER — Ambulatory Visit (INDEPENDENT_AMBULATORY_CARE_PROVIDER_SITE_OTHER): Payer: Medicaid Other

## 2022-03-05 ENCOUNTER — Other Ambulatory Visit: Payer: Self-pay

## 2022-03-05 VITALS — Ht 67.0 in | Wt 190.0 lb

## 2022-03-05 DIAGNOSIS — R5383 Other fatigue: Secondary | ICD-10-CM

## 2022-03-05 NOTE — Progress Notes (Signed)
Please sign medication order

## 2022-03-05 NOTE — Nursing Note (Signed)
03/05/22 1100   Medication Administration   Initials LR   Witness Initials TL   Medication  Cyanocobalamin B-12   Medication Dose Given 1,000   Medication Volume Given (mL) mcg   Route of Administration IM   Site Right Deltoid   NDC # [id number]   LOT # [id number]   Expiration date 04/09/23   Manufacturer [employer]   Clinic Supplied Yes   Patient Supplied No

## 2022-03-06 MED ORDER — CYANOCOBALAMIN (VIT B-12) 1,000 MCG/ML INJECTION SOLUTION
1000.0000 ug | Freq: Every day | INTRAMUSCULAR | 0 refills | Status: AC
Start: 2022-03-06 — End: ?

## 2022-03-07 ENCOUNTER — Other Ambulatory Visit: Payer: Self-pay

## 2022-03-07 ENCOUNTER — Ambulatory Visit (INDEPENDENT_AMBULATORY_CARE_PROVIDER_SITE_OTHER): Payer: Medicaid Other | Admitting: Rehabilitative and Restorative Service Providers"

## 2022-03-07 DIAGNOSIS — S43102A Unspecified dislocation of left acromioclavicular joint, initial encounter: Secondary | ICD-10-CM

## 2022-03-07 DIAGNOSIS — M7542 Impingement syndrome of left shoulder: Secondary | ICD-10-CM

## 2022-03-07 NOTE — Progress Notes (Signed)
Physical Therapy, [facility]  [address]  [place] [address]  [phone]  Physical Therapy Progress Note       Patient Name: Karen Kennedy  Date of Birth: 10/27/1986  Height:     Weight:     Room/Bed: Room/bed info not found  Payor: [employer] / Plan: [employer] [place] - [place] / Product Type: [place] /       Subjective & Objective:   Pt reports symptoms in left shoulder have improved since starting PT, but currently feels that progress is at a plateau. Pt has minimal pain during positioning, but has pain w/ activity in L shoulder.     See flowsheet for details. Manual PROM and end ROM stretching in anterior shoulder capsule, chest wall, and all planes of ROM. Manual assisted scapula mobility. Graston Technique in Left upper trapezius to promote soft tissue mobility. Therapeutic exercise including wand flexion, abduction, IR, corner stretch to promote ROM and mobility in L shoulder.    Assessment:    Pt has moderate redness in left shoulder following Graston Technique. Pt reports tenderness in anterior shoulder and across joint line. Pt initially has tightness in latissimus dorsi and triceps. Pt reports tightness returns quickly following treatment. Pt's shoulder is improving gradually. Discussed POC and pt will follow-up w/ PT in two weeks and continue manual therapy, but monitor progress.    Plan:     Continue to follow patient according to established plan of care.  The risks/benefits of therapy have been discussed with the patient/caregiver and he/she is in agreement with the established plan of care.       Therapist:   Glade Nurse, PT, DPT  03/07/2022, 16:08    Start Time: 1605  End Time: 1655  Total Treatment Time: 50 minutes, manual therapy 35 minutes, 2 units, therapeutic exercise 15 minutes, 1 unit

## 2022-03-21 ENCOUNTER — Encounter (INDEPENDENT_AMBULATORY_CARE_PROVIDER_SITE_OTHER): Payer: Medicaid Other | Admitting: Rehabilitative and Restorative Service Providers"

## 2022-03-22 ENCOUNTER — Encounter (INDEPENDENT_AMBULATORY_CARE_PROVIDER_SITE_OTHER): Payer: Self-pay | Admitting: Rehabilitative and Restorative Service Providers"

## 2022-03-28 ENCOUNTER — Ambulatory Visit (INDEPENDENT_AMBULATORY_CARE_PROVIDER_SITE_OTHER): Payer: Medicaid Other | Admitting: Rehabilitative and Restorative Service Providers"

## 2022-03-28 ENCOUNTER — Other Ambulatory Visit: Payer: Self-pay

## 2022-03-28 DIAGNOSIS — M7542 Impingement syndrome of left shoulder: Secondary | ICD-10-CM

## 2022-03-28 DIAGNOSIS — S43102A Unspecified dislocation of left acromioclavicular joint, initial encounter: Secondary | ICD-10-CM

## 2022-03-28 NOTE — Progress Notes (Unsigned)
Physical Therapy, [facility]  [address]  [place] [address]  [phone]  Physical Therapy Progress Note       Patient Name: Karen Kennedy  Date of Birth: January 27, 1986  Height:     Weight:     Room/Bed: Room/bed info not found  Payor: [employer] / Plan: [employer] [place] - [place] / Product Type: [place] /       Subjective & Objective:   Pt reports Left shoulder is feeling better. Reports only soreness after lifting and carrying with left shoulder.   See flowsheet for details. Manual PROM and end ROM stretching in anterior shoulder capsule, chest wall, and all planes of ROM. Manual assisted scapula mobility. Graston Technique in Left upper trapezius to promote soft tissue mobility. Therapeutic exercise including wand flexion, abduction, IR, corner stretch to promote ROM and mobility in L shoulder.    Assessment:    Pt has normal ROM in all planes. Pt has minimal symptoms in Left shoulder currently. Pt is gradually returning back to normal activities. Discussed POC and pt will continue w/ stretching and gradual return to normal activities. Pt will contact PT if any further treatment is necessary or any other issues arise.   Pt has mild redness following treatment this date, but demonstrates ROM WNL in all planes.     Plan:   Hold POC. Pt will contact PT if any questions or concerns arise.     Continue to follow patient according to established plan of care.  The risks/benefits of therapy have been discussed with the patient/caregiver and he/she is in agreement with the established plan of care.       Therapist:   Fredrik Cove, PT, DPT  03/28/2022, 15:18    Start Time: 1515  End Time: 1600  Total Treatment Time: 45 minutes, therapeutic exercise 10 minutes, 1 unit, manual therapy, 35 minutes,  2 units

## 2022-05-09 ENCOUNTER — Ambulatory Visit (INDEPENDENT_AMBULATORY_CARE_PROVIDER_SITE_OTHER): Payer: Medicaid Other | Admitting: Rehabilitative and Restorative Service Providers"

## 2022-05-09 ENCOUNTER — Other Ambulatory Visit: Payer: Self-pay

## 2022-05-09 DIAGNOSIS — S43102A Unspecified dislocation of left acromioclavicular joint, initial encounter: Secondary | ICD-10-CM

## 2022-05-09 DIAGNOSIS — M7542 Impingement syndrome of left shoulder: Secondary | ICD-10-CM

## 2022-05-09 NOTE — Progress Notes (Unsigned)
Physical Therapy, [facility]  [address]  [place] [address]  [phone]  Physical Therapy Progress Note       Patient Name: Karen Kennedy  Date of Birth: 21-Sep-1986  Height:     Weight:         Subjective & Objective:   Subjective Evaluation    History of Present Illness  Mechanism of injury: Pt referred to PT treatment for Left shoulder impingement and AC joint separation suffered during a slip and fall on 12/03/21. X-rays demonstrate grade 1 AC joint sprain per radiology report. Reports trying to catch someone falling and reached and caught them them, but injured Left shoulder approximately one month ago. Reports pain is "charley horse" pain in pectoralis region and anterior L shoulder. Difficulty reaching behind head and behind back w/ tightness. Pt had corticosteroid injection in L shoulder.    MRI is next if pain continues.    PLOF: R hand dominant, independent w/ all activities, works at [employer] office    Past Medical History:  Diagnosis           Date  .            Kidney stone         Past Surgical History:  Procedure          Laterality           Date  .            HX HYSTERECTOMY                .            HX OOPHORECTOMY                            .            LASIK                      Pain  Current pain rating: 2  Location: L anterior shoulder  Quality: dull ache and tight    Treatments  Current treatment: physical therapy  Patient Goals  Patient goals for therapy: decreased pain, increased motion, increased strength and return to sport/leisure activities            Assessment:   Objective     Passive Range of Motion   Left Shoulder   Flexion: 149 degrees   Abduction: 140 degrees   External rotation 0: 68 degrees   Internal rotation 0: 90 degrees with pain    Strength/Myotome Testing     Left Shoulder     Planes of Motion   Flexion: 4+   Abduction: 4+   External rotation at 0: 4+   External rotation at 90: 4+   Internal rotation at 0: 4+      Additional Strength Details  Empty can negative 5/5       Plan:   Assessment & Plan     Assessment  Impairments: abnormal coordination, abnormal gait, abnormal muscle firing, abnormal muscle tone, abnormal or restricted ROM, activity intolerance, impaired balance, impaired physical strength, lacks appropriate home exercise program and pain with function  Assessment details: Patient returns to physical therapy following 1 month break.  Patient reports general tightness and slight pain with activity in her anterior left shoulder.  Patient continues to perform HEP for stretching and stability.  Patient has not had an MRI of her  left shoulder.  Reports physical therapy improves symptoms immediately but symptoms returned after a few days.  Patient's pain is mild in vague around anterior shoulder.  Reports primarily tightness and stiffness.  PT and patient discussed treatment this date.  PT initiated ultrasound to decrease pain and inflammation in her left shoulder along with continued manual physical therapy.  Patient continues to have anterior shoulder tightness.  Patient may benefit from an MRI of her left shoulder to rule out other pathologies.  Patient's strength is within functional limits in range of motion is limited at in range of motion in all planes.  Patient referred to orthopedics office for further evaluation of pain in symptoms.  Patient previously had a corticosteroid injection in her left shoulder with improvement immediately.  Patient continues to have mild symptoms.  Prognosis: good    Goals  LTG's 12 weeks: 1. Pt will report 0-3/10 in L shoulder. 2. Pt will improve L shoulder strength to 4+/5 for reaching. 3. Pt will improve L shoulder ROM to within 15 degrees of R shoulder in all planes. 4. Pt will demo independence w/ HEP. 5. Pt will demo reaching behind head, behind back, and overhead w/o limitations.     Plan  Therapy options: will be seen for skilled physical therapy services  Planned modality  interventions: cryotherapy, electrical stimulation/Russian stimulation, TENS, thermotherapy (hydrocollator packs) and ultrasound  Planned therapy interventions: balance/weight-bearing training, bed mobility training, body mechanics training, fine motor coordination training, flexibility, functional ROM exercises, gait training, home exercise program, joint mobilization, manual therapy, motor coordination training, muscle pump exercises, neuromuscular re-education, postural training, soft tissue mobilization, strengthening and stretching  Frequency: 2x week  Duration in weeks: 12  Treatment plan discussed with: patient        Continue to follow patient according to established plan of care.  The risks/benefits of therapy have been discussed with the patient/caregiver and he/she is in agreement with the established plan of care.       Therapist:   Glade Nurse, PT, DPT  05/09/2022, 15:31     Start Time: 2774  End Time: 1607  Total Treatment Time: 45 minutes, manual therapy 25 minutes, 2 units, ultrasound 6 minutes, no charge, therapeutic exercise 14 minutes, 1 unit

## 2022-06-04 ENCOUNTER — Ambulatory Visit (INDEPENDENT_AMBULATORY_CARE_PROVIDER_SITE_OTHER): Payer: Medicaid Other | Admitting: Rehabilitative and Restorative Service Providers"

## 2022-06-04 ENCOUNTER — Other Ambulatory Visit: Payer: Self-pay

## 2022-06-04 VITALS — BP 110/66 | Ht 67.0 in | Wt 191.0 lb

## 2022-06-04 DIAGNOSIS — G8929 Other chronic pain: Secondary | ICD-10-CM

## 2022-06-04 DIAGNOSIS — M25512 Pain in left shoulder: Secondary | ICD-10-CM

## 2022-06-04 NOTE — Progress Notes (Signed)
[facility]    Progress Note    Name: JASMYN PICHA MRN:  [id number]   Date: 06/04/2022 Age: 36 y.o.      Chief Complaint: New Patient (Left shoulder pain)    HPI: Karen Kennedy is a 36 y.o. female presenting for evaluation of continued left shoulder pain.  Patient has date of injury was back in December 2022 when she sustained a fall with a [place] mechanism.  Patient re-injured her shoulder again in January 2023 when she caught pain falling off of the stove.  Patient has been working with physical therapy at [facility], and was not seeing much improvement.  However she started getting ultrasound a couple weeks ago and she "felt a pop at home doing range of motion" and has had significant improve in her symptoms.  She denies any radiating pain, numbness, tingling in the upper extremity and hand.  She rates her pain at 3/10 today.  She denies any other complaints this time.  Patient reports that she was seen and [place] and given a subacromial corticosteroid injection that did help her pain some.    Medical History     Past Medical History  Current Outpatient Medications   Medication Sig   . buPROPion (WELLBUTRIN XL) 150 mg extended release 24 hr tablet Take 1 Tablet (150 mg total) by mouth Once a day for 30 days   . buPROPion (WELLBUTRIN XL) 300 mg extended release 24 hr tablet Take 1 Tablet (300 mg total) by mouth Once a day for 30 days   . cyanocobalamin (VITAMIN B12) 1,000 mcg/mL Injection Solution Inject 1 mL (1,000 mcg total) into the muscle Once a day   . Ibuprofen (MOTRIN) 200 mg Oral Tablet Take 4 Tablets (800 mg total) by mouth Four times a day as needed for Pain   . phentermine (ADIPEX-P) 37.5 mg Oral Tablet One p.o. q.a.m.     Allergies   Allergen Reactions   . Zithromax [Azithromycin] Rash     Past Medical History:   Diagnosis Date   . Kidney stone          Past Surgical History:   Procedure Laterality Date   . HX HYSTERECTOMY     . HX OOPHORECTOMY     . LASIK        Family Medical History:    None         Social History     Socioeconomic History   . Marital status: Married   Tobacco Use   . Smoking status: Never   . Smokeless tobacco: Never   Substance and Sexual Activity   . Alcohol use: Never   . Drug use: Never   . Sexual activity: Yes     Partners: Male     Birth control/protection: Female Sterilization     Comment: HYSTERECTOMY       Objective   Review of Systems:  Constitutional: no fever, chills, or unexpected weight loss or weight gain   HEENT: no vision problem, earaches or sore throat  Respiratory: no cough or wheeze  Cardiovascular: no chest pain, palpitations or edema  Gastrointestinal: no abdominal pain, nausea, vomiting or diarrhea   Neurological: no headaches or weakness  Psych: no anxiety or depression  All other ROS Negative  Physical Exam:  BP 110/66   Ht 1.702 m (_0 )   Wt 86.6 kg (191 lb)   BMI 29.91 kg/m       GEN:   NAD  EYES: Sclera white.  NECK:   Full ROM without pain or neurologic changes.  Trachea midline.  CV:   PT and DP pulses present bilaterally.  PULM:   Normal respiratory effort.    MS: Evaluation of the left shoulder reveals no swelling. Normal Active ROM of the shoulder; pain noted with flexion and abduction. Full passive ROM. Mild Tenderness to palpation over the glenohumeral joint and subacromial region.  AC joint nontender.  Mild Pain with Hawkin's and Neer's impingement test. Cross body adduction test is negative. Negative empty can test, Negative horn blower sign for pain, negative active compression test of O'Brien. Manual muscle testing of the shoulder shows 4+/5 Flexion, 4+/5 Abduction, and 4+/5 ER.  Neurovascularly intact distally.  NEURO:   Alert and oriented to person, place and time.    PSYCHOSOCIAL:   Pleasant.  Normal affect.    WOUND/INCISION:   No wounds present.    Laboratory Studies/Data Reviewed   I have reviewed all available imagining and laboratory studies as appropriate.  No visits with results within 1  Month(s) from this visit.   Latest known visit with results is:   Appointment on 06/16/2021   Component Date Value Ref Range Status   . SODIUM 06/16/2021 140  136 - 145 mmol/L Final   . POTASSIUM 06/16/2021 3.8  3.5 - 5.1 mmol/L Final   . CHLORIDE 06/16/2021 103  96 - 111 mmol/L Final   . CO2 TOTAL 06/16/2021 31 (H)  22 - 30 mmol/L Final   . ANION GAP 06/16/2021 6  4 - 13 mmol/L Final   . BUN 06/16/2021 10  8 - 25 mg/dL Final   . CREATININE 06/16/2021 0.86  0.60 - 1.05 mg/dL Final   . BUN/CREA RATIO 06/16/2021 12  6 - 22 Final   . ESTIMATED GFR 06/16/2021 >90  >=60 mL/min/BSA Final    Estimated Glomerular Filtration Rate (eGFR) is calculated using the CKD-EPI (2021) equation, intended for patients 63 years of age and older. If gender is not documented or "unknown", there will be no eGFR calculation.  Stage, GFR, Classification   G1, 90, Normal or High   G2, 60-89, Mildly decreased   G3a, 45-59, Mildly to moderately decreased   G3b, 30-44, Moderately to severely decreased   G4, 15-29, Severely decreased   G5, <15, Kidney failure   In the absence of kidney damage, neither G1 or G2 fulfill criteria for CKD per KDIGO.     Marland Kitchen ALBUMIN 06/16/2021 4.0  3.5 - 5.0 g/dL  Final   . CALCIUM 06/16/2021 9.5  8.5 - 10.0 mg/dL Final   . GLUCOSE 06/16/2021 97  70 - 99 mg/dL Final   . ALKALINE PHOSPHATASE 06/16/2021 71  40 - 110 U/L Final   . ALT (SGPT) 06/16/2021 17  8 - 22 U/L Final   . AST (SGOT)  06/16/2021 14  8 - 45 U/L Final   . BILIRUBIN TOTAL 06/16/2021 0.6  0.3 - 1.3 mg/dL Final    Naproxen therapy can falsely elevate total bilirubin levels.   Marland Kitchen PROTEIN TOTAL 06/16/2021 6.9  6.4 - 8.3 g/dL Final   . CHOLESTEROL  06/16/2021 229 (H)  100 - 200 mg/dL Final   . HDL CHOL 06/16/2021 46 (L)  >=50 mg/dL Final   . TRIGLYCERIDES 06/16/2021 168 (H)  <150 mg/dL Final   . LDL CALC 06/16/2021 149 (H)  <100 mg/dL Final    <100 mg/dL, Optimal  100-129 mg/dL, Near/Above Optimal  130-159 mg/dL, Borderline High  160-189 mg/dL, High  >=190  mg/dL, Very high   . VLDL CALC 06/16/2021 34 (H)  <30 mg/dL Final   . NON-HDL 06/16/2021 183  <=190 mg/dL Final   . CHOL/HDL RATIO 06/16/2021 5.0   Final   . TSH 06/16/2021 1.508  0.430 - 3.550 uIU/mL Final    Pregnant patients have trimester-specific ranges derived from this analyzer/technology. For singleton pregnancies:    First trimester, 0.15 - 2.45 uIU/mL  Second trimester, 0.35 - 3.20 uIU/mL  Third trimester, 0.40 - 4.30 uIU/mL   . HEMOGLOBIN A1C 06/16/2021 5.0  <5.7 % Final    Hemoglobin A1c Ranges  Non-Diabetic:  <5.7  Increased Risk (pre-diabetic) for impaired glucose tolerance:  5.7 - 6.4  Consistent with diabetes (in not previously established diabetic patient:  > or = 6.5     . Hope 06/16/2021 4.4  mIU/mL Final    Premenopausal Ranges      Follicular 0.2-5.4 IU/L      Periovulatory 2.6-16.7 IU/L      Luteal 1.4-5.5 IU/L     Postmenopausal Range 26.7-133.4 IU/LmIU/mL   . ESTRADIOL 06/16/2021 68  pg/mL Final    Follicular, <27 - 062 pg/mL  Peri-ovulatory, 38 - 649 pg/mL  Mid-luteal, <24 - 312 pg/mL  Postmenopausal (on HRT), <24 - 144 pg/mL  Postmenupausal (not on HRT), <24 - 28 pg/mL  HRT, hormone replacement therapy  Estradiol methodology = chemiluminescent immunoassay performed on Abbott alinity    Estradiol results are falsely and dramatically elevated in patients taking mifepriston (RU-486) for up to 2 weeks, based on the bioavailability of the compound.   . WBC 06/16/2021 7.3  3.7 - 11.0 x10^3/uL Final   . RBC 06/16/2021 4.58  3.85 - 5.22 x10^6/uL Final   . HGB 06/16/2021 13.9  11.5 - 16.0 g/dL Final   . HCT 06/16/2021 42.1  34.8 - 46.0 % Final   . MCV 06/16/2021 91.9  78.0 - 100.0 fL Final   . MCH 06/16/2021 30.3  26.0 - 32.0 pg Final   . MCHC 06/16/2021 33.0  31.0 - 35.5 g/dL Final   . RDW-CV 06/16/2021 12.3  11.5 - 15.5 % Final   . PLATELETS 06/16/2021 193  150 - 400 x10^3/uL Final   . MPV 06/16/2021 9.5  8.7 - 12.5 fL Final   . NEUTROPHIL % 06/16/2021 68  % Final   . LYMPHOCYTE %  06/16/2021 25  % Final   . MONOCYTE % 06/16/2021 5  % Final   . EOSINOPHIL % 06/16/2021 1  % Final   . BASOPHIL % 06/16/2021 1  % Final   . NEUTROPHIL # 06/16/2021 4.97  1.50 - 7.70 x10^3/uL Final   . LYMPHOCYTE # 06/16/2021 1.83  1.00 - 4.80 x10^3/uL Final   . MONOCYTE # 06/16/2021 0.33  0.20 - 1.10 x10^3/uL Final   . EOSINOPHIL # 06/16/2021 <0.10  <=0.50 x10^3/uL Final   . BASOPHIL # 06/16/2021 <0.10  <=0.20 x10^3/uL Final   . IMMATURE GRANULOCYTE % 06/16/2021 0  0 - 1 % Final    The immature granulocyte fraction (IGF) quantifies total circulating myelocytes, metamyelocytes, and promyelocytes. It is used to evaluate immune responses to infection, inflammation, or other stimuli of the bone marrow. Caution is advised in interpreting test results in neonates who normally have greater numbers of circulating immature blood cells.     . IMMATURE GRANULOCYTE # 06/16/2021 <0.10  <0.10 x10^3/uL Final     Imaging:  No new imaging from today.  X-rays of  the left shoulder from 12/10/2021 were reviewed and shows no acute osseous abnormalities or fractures.  Well-preserved joint space of the AC joint and glenohumeral joint.    Assessment/Plan   Diagnosis:    ICD-10-CM    1. Chronic left shoulder pain  M25.512     G89.29         Plan:  X-ray findings reviewed discussed with the patient today.  Recommend that she continue with physical therapy.  We can repeat a subacromial corticosteroid injection if she needs.  Recommend anti-inflammatories.  Patient will contact the office if her symptoms fail to improve or worsen.  Patient voiced understanding, is agreeable to treatment plan she denies any further questions time.    No follow-ups on file.  Montez Hageman, PA-C      I am seeing this patient independently with co-signing supervising physician present in clinic.        This note was partially generated using MModal Fluency Direct system, and there may be some incorrect words, spellings, and punctuation that were not noted in  checking the note before saving

## 2022-06-11 ENCOUNTER — Other Ambulatory Visit: Payer: Self-pay

## 2022-06-11 ENCOUNTER — Ambulatory Visit (INDEPENDENT_AMBULATORY_CARE_PROVIDER_SITE_OTHER): Payer: Medicaid Other | Admitting: Rehabilitative and Restorative Service Providers"

## 2022-06-11 DIAGNOSIS — S43102A Unspecified dislocation of left acromioclavicular joint, initial encounter: Secondary | ICD-10-CM

## 2022-06-11 DIAGNOSIS — M7542 Impingement syndrome of left shoulder: Secondary | ICD-10-CM

## 2022-06-11 NOTE — Progress Notes (Signed)
Physical Therapy, [facility]  [address]  [place] [address]  [phone]  Physical Therapy Progress Note       Patient Name: Karen Kennedy  Date of Birth: 12-21-1985  Height:     Weight:     Room/Bed: Room/bed info not found  Payor: [employer] / Plan: [employer] [place] - MEDICAID / Product Type: Medicaid [facility] Out of State /       Subjective & Objective:     Patient returns to physical therapy following consult with Orthopedics.  Orthopedics instructed patient to continue physical therapy and expect continued healing over time of [facility] joint sprain.  Patient reports noticing improvement after therapeutic ultrasound at last visit and manual techniques.  Patient is preparing to leave for vacation this weekend.  Patient reports minimal  Pain on entrance today.  See flow sheet for details.  Therapeutic ultrasound performed to left superior anterior lateral shoulder to decrease pain and inflammation.  Manual therapy including Graston technique utilizing GT for sweeping and GT 3 brushing to promote soft tissue mobility.  Manual soft tissue mobilization and passive range of motion in left shoulder to promote scapular mobility and range of motion in ranges.    Assessment:    Patient denies pain throughout treatment.  Patient has improve mobility in her left shoulder with improved anterior chest wall mobility.  Patient has improved range of motion in all planes in range of motion within functional limits.  Patient denies significant pain with overhead reaching and reaching behind her back.  Patient is leaving for vacation at the end of this week.  PT treatment will be placed on hold and patient will contact physical therapy if any further treatment as needed.    Plan:   Hold physical therapy treatment at this time.  Patient will contact physical therapy further treatment is needed.  Continue to follow patient according to established plan of care.  The risks/benefits of therapy have been  discussed with the patient/caregiver and he/she is in agreement with the established plan of care.       Therapist:   Glade Nurse, PT, DPT  06/11/2022, 16:11    Start Time: 1606  End Time: 1652  Total Treatment Time: 46 minutes, manual therapy 38 minutes, 3 units, ultrasound 8 minutes, no charge

## 2022-07-16 ENCOUNTER — Ambulatory Visit (HOSPITAL_BASED_OUTPATIENT_CLINIC_OR_DEPARTMENT_OTHER): Payer: Self-pay

## 2022-08-06 ENCOUNTER — Other Ambulatory Visit (HOSPITAL_BASED_OUTPATIENT_CLINIC_OR_DEPARTMENT_OTHER): Payer: Self-pay | Admitting: Obstetrics & Gynecology

## 2022-08-06 DIAGNOSIS — F3289 Other specified depressive episodes: Secondary | ICD-10-CM

## 2022-08-06 MED ORDER — BUPROPION HCL XL 150 MG 24 HR TABLET, EXTENDED RELEASE
150.0000 mg | ORAL_TABLET | Freq: Every day | ORAL | 3 refills | Status: DC
Start: 2022-08-06 — End: 2022-10-08

## 2022-08-06 NOTE — Telephone Encounter (Signed)
RX approved and encounter closed

## 2022-08-06 NOTE — Telephone Encounter (Signed)
Received refill request from pharmacy:    Bupropion HCL XL 150 MG Tablet  Qty 30  Refills 11  Take 1 tablet by mouth every day    Annual scheduled 10/08/22

## 2022-10-08 ENCOUNTER — Other Ambulatory Visit: Payer: Self-pay

## 2022-10-08 ENCOUNTER — Ambulatory Visit (INDEPENDENT_AMBULATORY_CARE_PROVIDER_SITE_OTHER): Payer: Medicaid Other | Admitting: Obstetrics & Gynecology

## 2022-10-08 ENCOUNTER — Encounter (HOSPITAL_BASED_OUTPATIENT_CLINIC_OR_DEPARTMENT_OTHER): Payer: Self-pay | Admitting: Obstetrics & Gynecology

## 2022-10-08 VITALS — BP 116/68 | Ht 67.0 in | Wt 185.0 lb

## 2022-10-08 DIAGNOSIS — F3289 Other specified depressive episodes: Secondary | ICD-10-CM

## 2022-10-08 DIAGNOSIS — Z01419 Encounter for gynecological examination (general) (routine) without abnormal findings: Secondary | ICD-10-CM

## 2022-10-08 DIAGNOSIS — Z1239 Encounter for other screening for malignant neoplasm of breast: Secondary | ICD-10-CM

## 2022-10-08 MED ORDER — PHENTERMINE 37.5 MG TABLET
37.5000 mg | ORAL_TABLET | Freq: Every morning | ORAL | 2 refills | Status: AC
Start: 2022-10-08 — End: 2023-01-06

## 2022-10-08 MED ORDER — BUPROPION HCL XL 150 MG 24 HR TABLET, EXTENDED RELEASE
150.0000 mg | ORAL_TABLET | Freq: Every day | ORAL | 3 refills | Status: AC
Start: 2022-10-08 — End: 2023-10-03

## 2022-10-08 NOTE — Progress Notes (Signed)
[facility]  Gynecology Consult    Sherril, Shipman, 36 y.o. female  Date of Admission:  (Not on file)  Date of service: 10/08/2022  Date of Birth:  1986-09-04    PCP: No Pcp     There are no exam notes on file for this visit.     Information Obtained from: Patient  Chief Complaint   Patient presents with    Annual Exam       Mammogram:  desires baseline this year  Colonoscopy:  12 years ago      Here for annual and denies any concerns      HPI/subjective:  Karen Kennedy is a 36 y.o. female G2P2000, who presents for an annual exam. Patient reports that her children are now 84 years old and 15 years old. Her 35 year old is 6\' 2"  240 lbs. 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 She will also have her 150 mg Wellbutrin XL refilled. She has not had a baseline mammogram; She desires one this year. Her last colonoscopy was 12 years ago. Otherwise, she has been doing well and she has no further concerns or complaints.  Patient is desirous of weight loss.  She has lost about 15 lb since the summer.  Will give her phentermine it with 2 refills.  She has taken this in the past successfully.    Objective:  ROS:   Constitutional: No fever, chills, or fatigue  Cardiovascular: No chest pain, palpitations  Respiratory: No cough, shortness of breath  Abdomen: No abdominal pain, nausea, vomiting  Genitourinary: No vaginal discharge, vaginal bleeding  Musculoskeletal: No joint pain or swelling  Neurologic: No headaches, dizziness, or paresthesias    PHYSICAL EXAMINATION:   Height: 170.2 cm (5\' 7" )  Weight: 83.9 kg (185 lb)  BMI (Calculated): 29.04  BP (Non-Invasive): 116/68  No LMP recorded. Patient has had a hysterectomy.     Constitutional: No acute distress, well-developed, well-nourished  Head: Atraumatic, normocephalic  Cardiovascular: Heart with regular rate and rhythm, no murmurs, rubs, or gallops, normal pulses  Respiratory: Breath sounds are clear, no wheezes, rales, or rhonchi  Abdomen: Abdomen is soft,  non-tender without masses, no rebound or guarding  Breast:  Both breasts are dense without masses.  She has no tenderness or adenopathy  Genitourinary:  Uterus is absent.  She has no adnexal masses or tenderness.  Extremities: No cyanosis or edema  Neurologic: Grossly intact, alert and oriented x3, normal gait  Psychiatric: Normal affect and behavior      OB History   Gravida Para Term Preterm AB Living   2 2 2          SAB IAB Ectopic Multiple Live Births                  # Outcome Date GA Lbr Len/2nd Weight Sex Delivery Anes PTL Lv   2 Term            1 Term                Past Medical History:   Diagnosis Date    Kidney stone        Past Surgical History:   Procedure Laterality Date    HX HYSTERECTOMY      HX OOPHORECTOMY      LASIK         Allergies   Allergen Reactions    Zithromax [Azithromycin] Rash       Dating Summary  No pregnancy episode available          Current Outpatient Medications   Medication Sig    buPROPion (WELLBUTRIN XL) 150 mg extended release 24 hr tablet Take 1 Tablet (150 mg total) by mouth Once a day for 360 days    buPROPion (WELLBUTRIN XL) 300 mg extended release 24 hr tablet Take 1 Tablet (300 mg total) by mouth Once a day for 30 days    cyanocobalamin (VITAMIN B12) 1,000 mcg/mL Injection Solution Inject 1 mL (1,000 mcg total) into the muscle Once a day       Impression/Recommendations:  Assessment/Plan   1. Encounter for screening for malignant neoplasm of breast, unspecified screening modality    2. Encounter for well woman exam with routine gynecological exam      Orders Placed This Encounter    MAMMO BILATERAL SCREENING-ADDL VIEWS/BREAST US AS REQ BY RAD      No follow-ups on file.        I am scribing for, and in the presence of, Dr. Valla Leaver for services provided on 10/08/2022.  Rogene Houston, SCRIBE     I personally performed the services described in this documentation, as scribed  in my presence, and it is both accurate  and complete.    Radene Ou, MD

## 2022-10-30 ENCOUNTER — Encounter (HOSPITAL_BASED_OUTPATIENT_CLINIC_OR_DEPARTMENT_OTHER): Payer: Self-pay

## 2022-10-30 ENCOUNTER — Inpatient Hospital Stay
Admission: RE | Admit: 2022-10-30 | Discharge: 2022-10-30 | Disposition: A | Discharge status: Home / Self Care | Payer: Medicaid Other | Source: Ambulatory Visit | Attending: Obstetrics & Gynecology | Admitting: Obstetrics & Gynecology

## 2022-10-30 ENCOUNTER — Other Ambulatory Visit: Payer: Self-pay

## 2022-10-30 DIAGNOSIS — Z1239 Encounter for other screening for malignant neoplasm of breast: Secondary | ICD-10-CM

## 2022-10-30 DIAGNOSIS — Z1231 Encounter for screening mammogram for malignant neoplasm of breast: Secondary | ICD-10-CM | POA: Insufficient documentation
